# Patient Record
Sex: Female | Born: 1980 | Race: White | Hispanic: No | Marital: Married | Smoking: Never smoker
Health system: Southern US, Academic
[De-identification: ages and names within clinical notes are randomized; demographics above are authoritative.]

## PROBLEM LIST (undated history)

## (undated) DIAGNOSIS — N809 Endometriosis, unspecified: Secondary | ICD-10-CM

## (undated) DIAGNOSIS — K589 Irritable bowel syndrome without diarrhea: Secondary | ICD-10-CM

## (undated) DIAGNOSIS — G43909 Migraine, unspecified, not intractable, without status migrainosus: Secondary | ICD-10-CM

## (undated) DIAGNOSIS — M3500 Sicca syndrome, unspecified: Secondary | ICD-10-CM

## (undated) HISTORY — DX: Migraine, unspecified, not intractable, without status migrainosus: G43.909

## (undated) HISTORY — PX: COMBINED LAPAROSCOPY W/ HYSTEROSCOPY: SUR299

## (undated) HISTORY — DX: Sjogren syndrome, unspecified (CMS HCC): M35.00

## (undated) HISTORY — DX: Irritable bowel syndrome without diarrhea: K58.9

## (undated) HISTORY — PX: HX DILATION AND CURETTAGE: SHX78

## (undated) HISTORY — DX: Endometriosis, unspecified: N80.9

## (undated) HISTORY — PX: HX GALL BLADDER SURGERY/CHOLE: SHX55

## (undated) HISTORY — PX: LUMBAR DISC SURGERY: SHX700

## (undated) HISTORY — PX: HX APPENDECTOMY: SHX54

---

## 2006-01-02 ENCOUNTER — Other Ambulatory Visit (HOSPITAL_COMMUNITY): Payer: Self-pay | Admitting: OBSTETRICS/GYNECOLOGY

## 2021-03-20 ENCOUNTER — Ambulatory Visit (INDEPENDENT_AMBULATORY_CARE_PROVIDER_SITE_OTHER): Payer: Self-pay

## 2021-06-12 ENCOUNTER — Encounter (INDEPENDENT_AMBULATORY_CARE_PROVIDER_SITE_OTHER): Payer: Self-pay | Admitting: OBSTETRICS/GYNECOLOGY

## 2021-06-13 ENCOUNTER — Ambulatory Visit (INDEPENDENT_AMBULATORY_CARE_PROVIDER_SITE_OTHER): Payer: 59 | Admitting: OBSTETRICS/GYNECOLOGY

## 2021-06-13 ENCOUNTER — Other Ambulatory Visit: Payer: Self-pay

## 2021-06-13 ENCOUNTER — Encounter (INDEPENDENT_AMBULATORY_CARE_PROVIDER_SITE_OTHER): Payer: Self-pay | Admitting: OBSTETRICS/GYNECOLOGY

## 2021-06-13 ENCOUNTER — Other Ambulatory Visit: Payer: 59 | Attending: OBSTETRICS/GYNECOLOGY | Admitting: OBSTETRICS/GYNECOLOGY

## 2021-06-13 ENCOUNTER — Other Ambulatory Visit: Payer: 59 | Attending: OBSTETRICS/GYNECOLOGY

## 2021-06-13 VITALS — BP 115/82 | HR 76 | Ht 67.0 in | Wt 162.0 lb

## 2021-06-13 DIAGNOSIS — N951 Menopausal and female climacteric states: Secondary | ICD-10-CM

## 2021-06-13 DIAGNOSIS — N921 Excessive and frequent menstruation with irregular cycle: Secondary | ICD-10-CM

## 2021-06-13 DIAGNOSIS — R635 Abnormal weight gain: Secondary | ICD-10-CM | POA: Insufficient documentation

## 2021-06-13 DIAGNOSIS — Z01419 Encounter for gynecological examination (general) (routine) without abnormal findings: Secondary | ICD-10-CM

## 2021-06-13 DIAGNOSIS — N946 Dysmenorrhea, unspecified: Secondary | ICD-10-CM | POA: Insufficient documentation

## 2021-06-13 DIAGNOSIS — R5381 Other malaise: Secondary | ICD-10-CM | POA: Insufficient documentation

## 2021-06-13 DIAGNOSIS — R5383 Other fatigue: Secondary | ICD-10-CM

## 2021-06-13 DIAGNOSIS — N898 Other specified noninflammatory disorders of vagina: Secondary | ICD-10-CM

## 2021-06-13 LAB — COMPREHENSIVE METABOLIC PANEL, NON-FASTING
ALBUMIN/GLOBULIN RATIO: 1.7 — ABNORMAL HIGH (ref 0.8–1.4)
ALBUMIN: 4.7 g/dL (ref 3.5–5.7)
ALKALINE PHOSPHATASE: 61 U/L (ref 34–104)
ALT (SGPT): 23 U/L (ref 7–52)
ANION GAP: 9 mmol/L — ABNORMAL LOW (ref 10–20)
AST (SGOT): 19 U/L (ref 13–39)
BILIRUBIN TOTAL: 0.6 mg/dL (ref 0.3–1.2)
BUN/CREA RATIO: 10 (ref 6–22)
BUN: 8 mg/dL (ref 7–25)
CALCIUM, CORRECTED: 8.8 mg/dL — ABNORMAL LOW (ref 8.9–10.8)
CALCIUM: 9.5 mg/dL (ref 8.6–10.3)
CHLORIDE: 105 mmol/L (ref 98–107)
CO2 TOTAL: 23 mmol/L (ref 21–31)
CREATININE: 0.83 mg/dL (ref 0.60–1.30)
ESTIMATED GFR: 90 mL/min/{1.73_m2} (ref >59)
GLOBULIN: 2.7 — ABNORMAL LOW (ref 2.9–5.4)
GLUCOSE: 100 mg/dL (ref 74–109)
OSMOLALITY, CALCULATED: 272 mOsm/kg (ref 270–290)
POTASSIUM: 3.7 mmol/L (ref 3.5–5.1)
PROTEIN TOTAL: 7.4 g/dL (ref 6.4–8.9)
SODIUM: 137 mmol/L (ref 136–145)

## 2021-06-13 LAB — CBC WITH DIFF
BASOPHIL #: 0 10*3/uL (ref 0.00–0.30)
BASOPHIL %: 1 % (ref 0–3)
EOSINOPHIL #: 0 10*3/uL (ref 0.00–0.80)
EOSINOPHIL %: 1 % (ref 0–7)
HCT: 42.1 % (ref 37.0–47.0)
HGB: 14.2 g/dL (ref 12.5–16.0)
LYMPHOCYTE #: 1.6 10*3/uL (ref 1.10–5.00)
LYMPHOCYTE %: 24 % — ABNORMAL LOW (ref 25–45)
MCH: 29.7 pg (ref 27.0–32.0)
MCHC: 33.7 g/dL (ref 32.0–36.0)
MCV: 88.1 fL (ref 78.0–99.0)
MONOCYTE #: 0.4 10*3/uL (ref 0.00–1.30)
MONOCYTE %: 6 % (ref 0–12)
MPV: 9.1 fL (ref 7.4–10.4)
NEUTROPHIL #: 4.5 10*3/uL (ref 1.80–8.40)
NEUTROPHIL %: 69 % (ref 40–76)
PLATELETS: 247 10*3/uL (ref 140–440)
RBC: 4.78 10*6/uL (ref 4.20–5.40)
RDW: 14 % (ref 11.6–14.8)
WBC: 6.6 10*3/uL (ref 4.0–10.5)
WBCS UNCORRECTED: 6.6 10*3/uL

## 2021-06-13 LAB — THYROID STIMULATING HORMONE (SENSITIVE TSH): TSH: 0.654 u[IU]/mL (ref 0.450–5.330)

## 2021-06-13 LAB — C-REACTIVE PROTEIN(CRP),INFLAMMATION: C-REACTIVE PROTEIN (CRP): 0.4 mg/dL (ref 0.1–0.5)

## 2021-06-13 LAB — THYROXINE, FREE (FREE T4): THYROXINE (T4), FREE: 0.79 ng/dL (ref 0.58–1.64)

## 2021-06-13 LAB — FSH: FOLLICLE STIMULATING HORMONE: 5.84

## 2021-06-13 NOTE — Procedures (Signed)
[facility]  [address]  [place] [address]    Procedure Note    Name: Kelly Fuller MRN:  [id number]   Date: 06/13/2021 Age: 41 y.o.       Procedures    Conley Rolls, DO

## 2021-06-13 NOTE — Nursing Note (Signed)
Here for yearly exam.  LMP 05-19-21.  Period in March was 10 days late, April was few days early and May is late.  First time that she has had an issue with periods.  Periods are heavy, clots and gushes.  Has cramping and pain with cycles scale 7/10.    Having hot flashes occasionally.    Evette Cristal, CMA

## 2021-06-13 NOTE — Procedures (Signed)
[facility]  [address]  [place] [address]    Procedure Note    Name: Kelly Fuller MRN:  [id number]   Date: 06/13/2021 Age: 41 y.o.       [phone] -  WET MOUNT FOR INFECTIOUS AGENTS (EG, SALINE,INDIA INK, KOH) (AMB ONLY)  Performed by: Remonia Richter, DO  Authorized by: Remonia Richter, DO     Time Out:     Immediately before the procedure, a time out was called:  Yes    Patient verified:  Yes    Procedure Verified:  Yes    Site Verified:  Yes  Documentation:      Wet prep reveals squamous cells no white cells no hyphae no clue cells is normal wet        Remonia Richter, DO

## 2021-06-13 NOTE — Nursing Note (Signed)
Breakfast- nothing  Lunch-salad  Dinner- meat and vegatables  Snacks-popcorn  Drinks-water and diet soda  Exercise- 5 times a week

## 2021-06-13 NOTE — H&P (Signed)
[facility]  [address]  [place] [address]    Annual Exam    Name: Kelly Fuller MRN:  [id number]   Date: 06/13/2021 Age: 41 y.o.     Reason for Visit:  Chief Complaint   Patient presents with   . Annual Exam     Pap smear       History of Present Illness:   Kelly Fuller is a 41 y.o. female here for annual well woman exam.  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  For breakfast she does not eat breakfast lunch salads dinner meat and vegetables snacks popcorn water diet soda she is been exercises 5 times a week recently    Change in sexual partner: No  Abnormal pap history:  No  Last pap:      STD history:  none known  HRT use:  No  Last Mammogram:  No results found for this or any previous visit (from the past 58309 hour(s)).    Last DXA:  No results found for this or any previous visit (from the past [id number] hour(s)).  Menses:  Cycles one time per month  Contraception:  none    There are no problems to display for this patient.      Patient History:  Past Medical History:   Diagnosis Date   . Endometriosis    . Irritable bowel syndrome    . Migraines    . Sjogren's syndrome (CMS HCC)           Past Surgical History:   Procedure Laterality Date   . COMBINED LAPAROSCOPY W/ HYSTEROSCOPY N/A    . HX APPENDECTOMY     . HX CHOLECYSTECTOMY     . HX DILATION AND CURETTAGE N/A    . LUMBAR DISC SURGERY N/A           Family Medical History:     Problem Relation (Age of Onset)    Cancer Mother    Heart Disease Father, Mother          Social History     Socioeconomic History   . Marital status: Married    Tobacco Use   . Smoking status: Never   . Smokeless tobacco: Never   Vaping Use   . Vaping Use: Never used   Substance and Sexual Activity   . Alcohol use: Yes     Comment: rarely   . Drug use: Never   . Sexual activity: Yes     Birth control/protection: Vasectomy      Current Outpatient Medications   Medication Sig   . albuterol sulfate (PROVENTIL OR VENTOLIN OR PROAIR) 90 mcg/actuation Inhalation oral inhaler    . diclofenac sodium (VOLTAREN) 75 mg Oral Tablet, Delayed Release (E.C.) diclofenac sodium 75 mg tablet,delayed release (Patient not taking: Reported on 06/13/2021)   . dicyclomine (BENTYL) 20 mg Oral Tablet dicyclomine 20 mg tablet (Patient not taking: Reported on 06/13/2021)   . ergocalciferol, vitamin D2, (DRISDOL) 1,250 mcg (50,000 unit) Oral Capsule Vitamin D2 1,250 mcg (50,000 unit) capsule   . hydrOXYchloroQUINE (PLAQUENIL)  200 mg Oral Tablet hydroxychloroquine 200 mg tablet (Patient not taking: Reported on 06/13/2021)   . Ibuprofen (MOTRIN) 800 mg Oral Tablet    . metoclopramide HCl (REGLAN) 10 mg Oral Tablet metoclopramide 10 mg tablet (Patient not taking: Reported on 06/13/2021)     No Known Allergies    OB History   Gravida Para Term Preterm AB Living   0 0 0 0 0 0   SAB IAB Ectopic Multiple Live Births   0 0 0 0 0     Social History     Substance and Sexual Activity   Sexual Activity Yes   . Birth control/protection: Vasectomy       She wears her seatbelt.  She gets calcium in her diet.  Performing self breast evaluations monthly.  The patient denies any abuse or violence and feels safe at home.    Review of Systems:  The patient denies any bowel or bladder symptoms.   normal menses, no abnormal bleeding, pelvic pain or discharge, no breast pain or new or enlarging lumps on self exam, she complains of heavy irregular periods pain with her.  She is concerned endometriosis is recurrent she also has left breast pain 20 lb weight gain please are ideal body weight is approximately 140  lb  Constitutional:  No unexplained weight gain or loss, no loss of appetite, no fever, no night sweats or chills.  Ears nose mouth and throat:  No difficulty with hearing, no sinus problems, no runny nose, no postnasal drip, no ringing ears, no mouth sores, loose teeth, ear pain, nose bleeds, sore throat, facial pain or numbness.  Cardiovascular:  No irregular heart beats, racing heartbeat, chest pains, swelling of feet or legs, no pain in legs with walking.  Respiratory:  No shortness of breath, no night sweats, no prolonged cough, wheezing, sputum production, no hemoptysis.  Gastrointestinal:  No heartburn, constipation, intolerance to foods, diarrhea abdominal pain, difficulty swallowing, nausea, vomiting, and no change in bowel movements.  Integument:  No rash, itching, skin lesions, change in hair.  Neurologic:  No frequent headaches, double vision, weakness, changes in station, problems with walking or balance, dizziness, tremor, loss of consciousness no visual loss.  Psychiatric:  No insomnia, irritability, depression, anxiety, mood swings, hallucinations.  Endocrine no intolerance to heat and cold, no frequent hunger, urination, thirst.  No changes sex drive.  Hematologic:  No easy bleeding, easy bruising, anemia, abnormal swollen areas.  Allergy/immunology:  No seasonal allergies, hay fever, itching, frequent infections, no exposure to HIV.    Physical Exam:  BP 115/82   Pulse 76   Ht 1.702 m (5\' 7" )   Wt 73.5 kg (162 lb)   LMP 05/19/2021   Breastfeeding No   BMI 25.37 kg/m        General: No acute distress.  Skin: Intact.  No visible rashes, no jaundice.  HEENT: Normocephalic, Atraumatic, No obvious lesions.  Neck/Thyroid: No evidence of thyromegaly or lymphadenopathy.  Respiratory: Lungs clear to auscultation bilaterally.  Cardiac: Regular rate and rhythm without murmur, rub, or gallop.  Abdomen: Soft, Non tender, Non distended without guarding or rebound.   Extremities: No cyanosis, clubbing, or  edema.  Neurologic: Orientation appropriate. No gross motor abnormalities or deficits noted.  Psychiatric: Mood stable.    Breasts:  No masses or nodes  Genitourinary:   External Exam: External exam normal female genitalia.  No urethral tenderness.  No genital lesions present.    Vaginal Exam:  Vagina normal to exam. Moist  and pink vaginal mucosa, no abnormal discharge.   Bladder:  Bladder is normal to exam. Suprapubic fullness not present.    Uterus: Uterus is normal to exam. Cervix is normal to exam. No cervical discharge and no cervical motion tenderness.   Position:  Uterus top-normal size anteflexed   Contour:  Regular.    Mobility:  Mobile.    Adnexa:  No palpable masses and no tenderness bilaterally.    Rectovaginal exam:   Not done     Assessment/Plan:   Routine gynecological exam with cervical pap smear.    ENCOUNTER DIAGNOSES     ICD-10-CM   1. Encounter for well woman exam with routine gynecological exam  Z01.419   2. Metrorrhagia  N92.1   3. Menopausal symptoms  N95.1   4. Malaise and fatigue  R53.81    R53.83   5. Abnormal weight gain  R63.5        Counseling/Education:  Reviewed ASCCP guidelines for cervical cancer screening.  Reviewed breast self exams and breast awareness.    A lipid panel should be followed with her PCP.    A mammogram will be ordered yearly.  The patient was counseled on diet, exercise, calcium in her diet, and avoidance of tobacco products.  Colonoscopy guidelines discussed and immunizations.    Orders Placed This Encounter   . CBC/DIFF   . C-REACTIVE PROTEIN(CRP),INFLAMMATION   . COMPREHENSIVE METABOLIC PANEL, NON-FASTING   . FSH   . THYROID STIMULATING HORMONE (SENSITIVE TSH)   . THYROXINE, FREE (FREE T4)   . T3 (TRIIODOTHYRONINE), FREE, SERUM   . THIN PREP PAP REFLEX TO HPV MRNA IF ASCUS ([facility])     Return in about 2 weeks (around 06/27/2021) for ultrasound.  Ordered labs including CBC CMP CRP urinalysis C&S discussed her diet discussed weight loss discussed medical management  of her weight loss including phentermine get her handouts on laparoscopy hysteroscopy menopausal symptoms endometriosis endometrial ablation she would a laparoscopic cholecystectomy and laparoscopic appy by Dr.Eeels in [place] in 2020 wet prep was also obtained which is negative    Remonia Richterandy Aland Chestnutt, DO  06/13/2021, 12:18

## 2021-06-14 LAB — T3 (TRIIODOTHYRONINE), FREE, SERUM: T3 FREE: 2.6 pg/mL (ref 1.7–3.7)

## 2021-06-28 ENCOUNTER — Other Ambulatory Visit (INDEPENDENT_AMBULATORY_CARE_PROVIDER_SITE_OTHER): Payer: Self-pay | Admitting: OBSTETRICS/GYNECOLOGY

## 2021-07-04 ENCOUNTER — Encounter (INDEPENDENT_AMBULATORY_CARE_PROVIDER_SITE_OTHER): Payer: Self-pay | Admitting: OBSTETRICS/GYNECOLOGY

## 2021-07-07 LAB — THIN PREP PAP REFLEX TO HPV MRNA IF ASCUS (QUEST)

## 2021-08-20 ENCOUNTER — Encounter (INDEPENDENT_AMBULATORY_CARE_PROVIDER_SITE_OTHER): Payer: 59 | Admitting: OBSTETRICS/GYNECOLOGY

## 2022-05-11 ENCOUNTER — Emergency Department: Payer: 59 | Admitting: Physician Assistant

## 2022-05-11 DIAGNOSIS — U071 COVID-19: Secondary | ICD-10-CM

## 2022-05-11 MED ORDER — MOLNUPIRAVIR 200 MG CAPSULE (EUA)
800.0000 mg | ORAL_CAPSULE | Freq: Two times a day (BID) | ORAL | 0 refills | Status: AC
Start: 2022-05-11 — End: 2022-05-16

## 2022-05-11 MED ORDER — NIRMATRELVIR 300 MG (150 MG X2)-RITONAVIR 100 MG TABLET,DOSE PACK
3.0000 | ORAL_TABLET | Freq: Two times a day (BID) | ORAL | 0 refills | Status: DC
Start: 2022-05-11 — End: 2022-05-11

## 2022-05-11 MED ORDER — OXYMETAZOLINE 0.05 % NASAL SPRAY
1.0000 | Freq: Two times a day (BID) | NASAL | 0 refills | Status: AC | PRN
Start: 2022-05-11 — End: ?

## 2022-05-11 MED ORDER — SODIUM BICARBONATE-SODIUM CHLORIDE-NETI POT NASAL RINSE WITH PACKET
1.0000 | PACK | Freq: Two times a day (BID) | ENDOSINUSIAL | 0 refills | Status: AC
Start: 2022-05-11 — End: ?

## 2022-05-11 MED ORDER — BENZONATATE 100 MG CAPSULE
ORAL_CAPSULE | ORAL | 0 refills | Status: AC
Start: 2022-05-11 — End: ?

## 2022-05-11 NOTE — E-Visit Routing Comment (Signed)
Evisit received, reviewed and forwarded to provider pool as per patient request and per protocol.London Pepper, RN  05/11/2022 21:46

## 2022-05-11 NOTE — Progress Notes (Signed)
[facility]  [address]  [place] [address]  E-Visit Note  Attending Dr. Zada Girt  Reason for E-Visit: Visit Diagnosis with associated orders:     ICD-10-CM    1. COVID-19  U07.1         [facility] E-Visit Covid-19 Base       Question 05/11/2022  9:27 PM EDT - Kelly Fuller by Patient    Have you been in direct contact with a person with COVID-19 within the last 14 days? Yes    Have you tested positive for COVID-19? Yes    Please enter the date you tested positive.  05/11/2022    Are you taking any medications for your symptoms? No    When did you first start experiencing symptoms related to COVID-19? 05/10/2022    Do you have a fever or are feeling feverish (chills, sweating)? Yes    If you have a thermometer, what is your current temperature (in [place])?  (range: 0 - 150) 101.7    Have you had any of the following symptoms?     Are you having a cough today? Yes    Do you have a runny or stuffy nose?   Yes    Are you experiencing severe and constant pain or pressure in the chest? Yes    Do you have loss of taste? No    Do you have loss of smell? No    Are you vomiting? No    Are you experiencing diarrhea? Yes    Are you experiencing fatigue today? Yes    Do you have muscle aches, body aches, or a headache? Yes    Are you having difficulty breathing?  No trouble breathing    Do you feel more ill than you did yesterday or about the same? More ill    Is there someone regularly available to help you take care of yourself? Yes    Do any of these apply to you? (Check any) Weakened immune system or taking medications that may cause immune suppression            No images are attached to the encounter or orders placed in the encounter.   Assessment:   Assessment/Plan   1. COVID-19        I'm sorry to hear your COVID-19 test result came back positive. Unfortunately antibiotics do not work with viral illnesses. I am recommending a home quarantine. You shall not leave your own home for any  reason other than hospitalization for 5 days from the date of your positive test, or the start of symptoms, which ever day came first. You will need to immediately notify your employer and other household members. ALL household members are also quarantined due to potential exposure if they have not been vaccinated against the virus. It is possible to transmit the disease even when you do not have any symptoms at all. Sometimes after 1 week of symptoms, COVID can severely worsen, causing breathing difficulty. If you feel short of breath or have chest pain you need to go immediately to the emergency department. Please take tylenol and other OTC meds for symptomatic relief.  Do not eat or drink after others, wear a mask if other household members are nearby. Sanitize surfaces frequently. Sneeze into the crease of the elbow and wash hands very frequently. It is important that unaffected individuals also wash hands frequently and do not touch his or her face. Stay at least 6 feet  away from all other individuals at all times.  You will start quarantine then then assess your symptoms and temperature at the end of the 5 days. If you are not feeling significantly better, or you still have a fever (100.46F +) you will need to extend the quarantine by an additional 5 days, for a total of 10 days. Please note that if you are feeling completely better and you return to work/school, you must wear a mast at all times for the first 5 days of return. This mask must not be home-made, must fit snuggly over your mouth and nose, and may not be porous or see-through. If you cannot tolerate the mask, are too young to wear a mask, or reliably and consistently wear the mask as described, you will need to extend the quarantine to a full 10 days. Again, if you are feeling short of breath please go to the nearest emergency department. Please get plenty of rest and stay hydrated by drinking plenty of fluids. This is the mainstay of treatment for  most viral infections. If you have a fever for more than 5 days, or develop one later in the illness please be seen in person for further evaluation. I have sent symptomatic meds to your pharmacy. If you have any severe symptoms go to the ER.     If you are over the age of 19 and/or have any long term health issues such as diabetes, HIV, morbid obesity or other risk factors for severe COVID, please contact your PCP right away to discuss antiviral treatment.     Due to your age and/or indicated health risk factors, I have sent an antiviral to your pharmacy. Do not take cholesterol-lowering medications such as statins while taking this medication. Additionally, if you've ever had any kidney disease you should talk to your doctor about having blood work rechecked before starting the medication. Keep in mind, that if you wish to take the antiviral, you must start it within the first 5 days of symptoms.    Orders Placed This Encounter    nirmatrelvir-ritonavir (PAXLOVID) 300 mg (150 mg x 2)-100mg  tablet therapy pack    oxymetazoline (AFRIN) 0.05 % Nasal Spray, Non-Aerosol    sod bicarb-sod chlor-neti pot sinus irrigation packet with rinse device    benzonatate (TESSALON PERLES) 100 mg Oral Capsule        Preferred Pharmacy       [facility] [address] [place], [place] - [address]    [address] [place] [address]    Phone: [phone] Fax: [phone]    Hours: Not open 24 hours          If for some reason you need to change your pharmacy, simply call the new preferred pharmacy and ask them to retrieve the prescription from the above listed pharmacy.     The patient (or their delegate) initiated request for e-visit using MyChart patient portal.  See MyChart messages documented in this encounter for details of online digital evaluation and management provided.    I personally spent 6 minutes of cumulative time performing this service (within 7 days since e-visit initiated).    Evlyn Courier Brimley, PA-C   05/11/2022, 21:46

## 2022-05-11 NOTE — Addendum Note (Signed)
Addended by: Aggie Hacker RENEE on: 05/11/2022 10:08 PM     Modules accepted: Orders

## 2022-11-08 IMAGING — MR MRI CERVICAL SPINE WITHOUT CONTRAST
4 of 5 series · 24 of 48 positions shown · IV contrast (gadolinium)
Comparison: None available.

﻿EXAM:  [id number]   MRI CERVICAL SPINE WITHOUT CONTRAST
INDICATION: 42-year-old with chronic neck pain. Prior C-spine surgery at C5-6 level.
TECHNIQUE: Multiplanar multisequential MRI of the C-spine was performed without gadolinium contrast.

[Series 5: T2 · sagittal · 3.5mm · 0.75mm/px · 8 of 13 slices shown (1 of 2)]
[im 1/13]
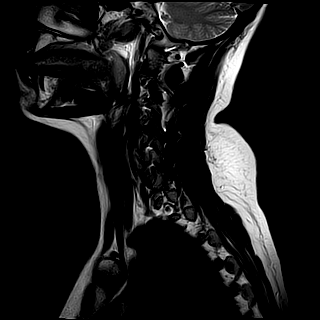
[im 2/13]
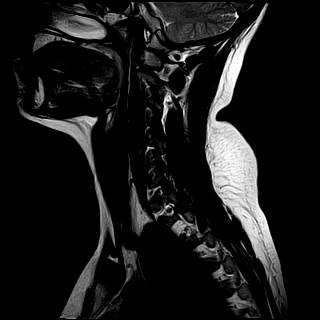
[im 4/13]
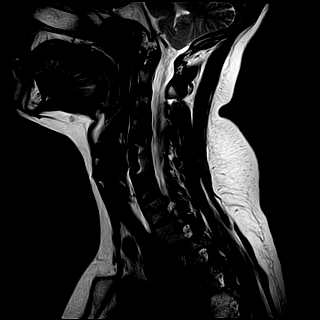
[im 6/13]
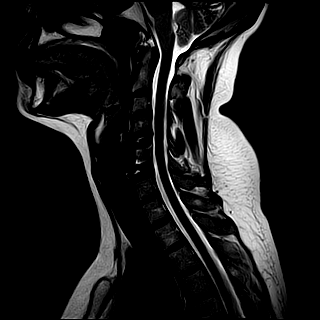
[im 7/13]
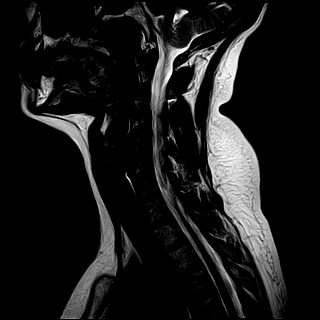
[im 9/13]
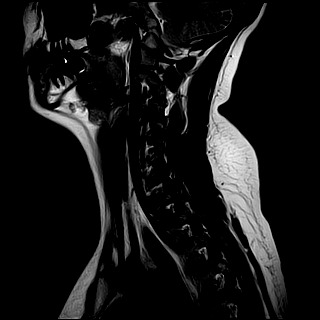
[im 11/13]
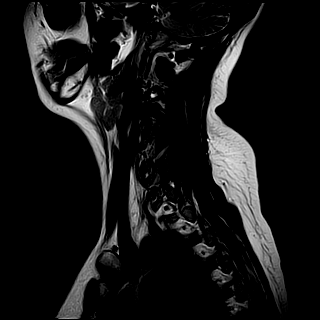
[im 13/13]
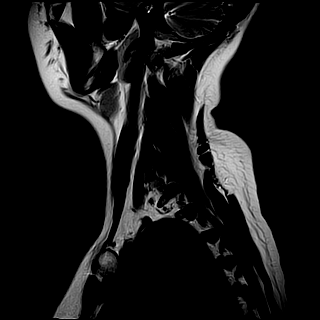

[Series 6: T1 · sagittal · 3.5mm · 0.47mm/px · 4 of 13 slices shown]
[im 1/13]
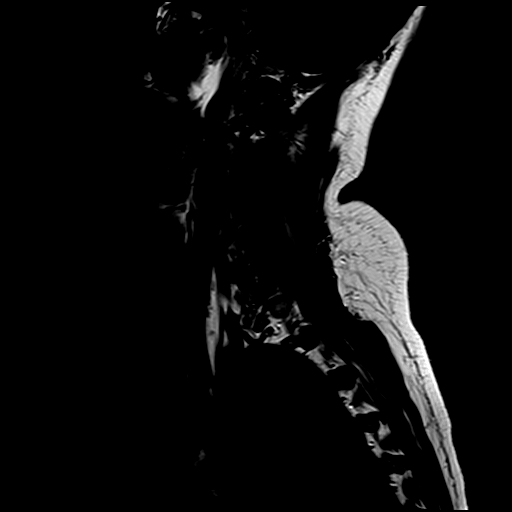
[im 2/13]
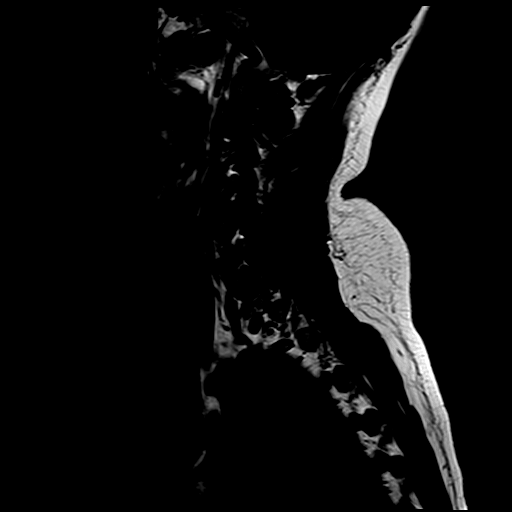
[im 7/13]
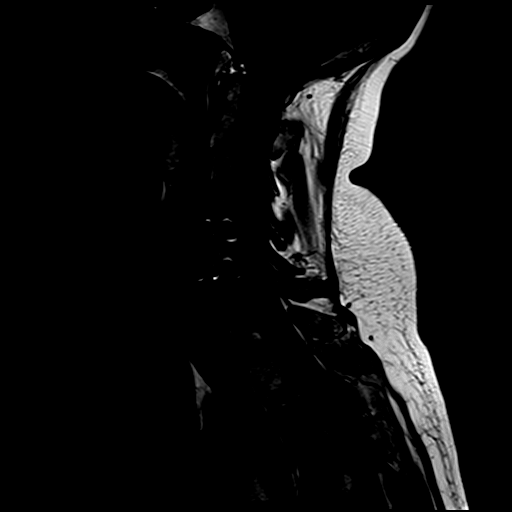
[im 11/13]
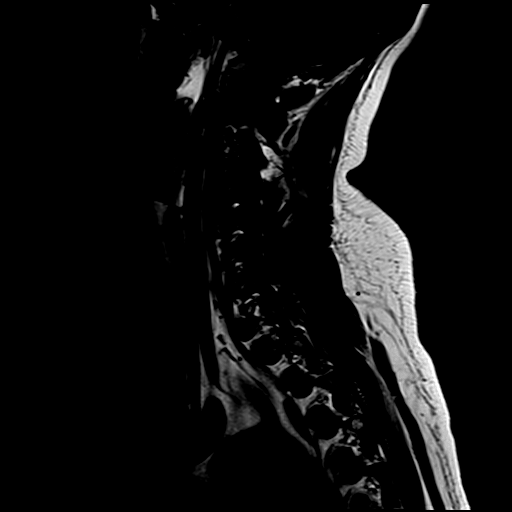

[Series 7: STIR · sagittal · 3.5mm · 0.47mm/px · 3 of 13 slices shown]
[im 2/13]
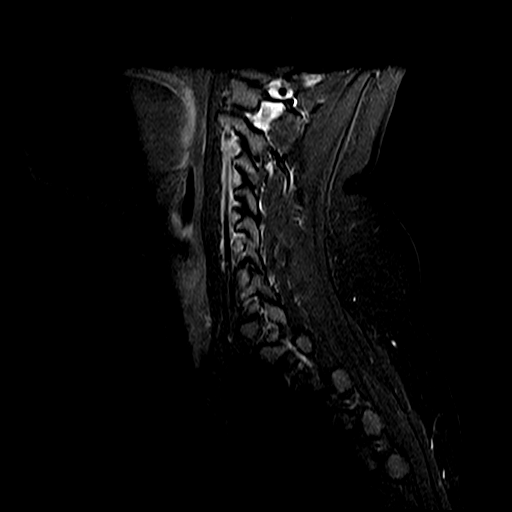
[im 7/13]
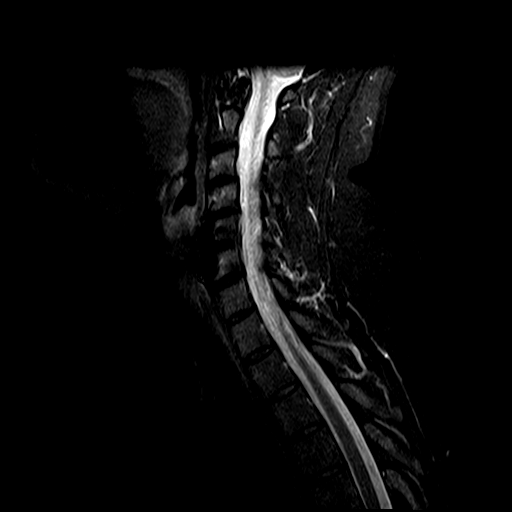
[im 11/13]
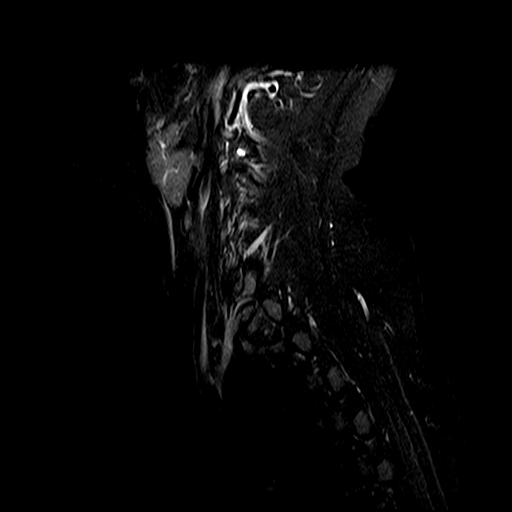

[Series 9: T2 · axial · 3.0mm · 0.39mm/px · z∈[-68,+40]mm · 9 of 18 slices shown (2 of 2)]
[im 1/18]
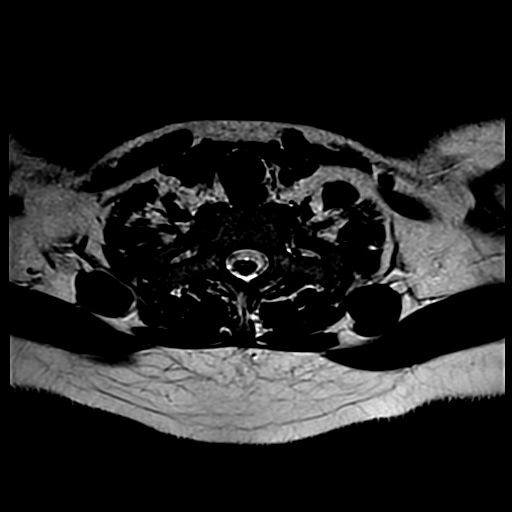
[im 4/18]
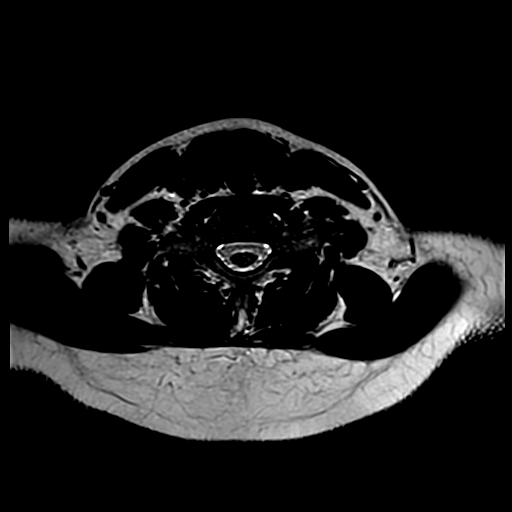
[im 5/18]
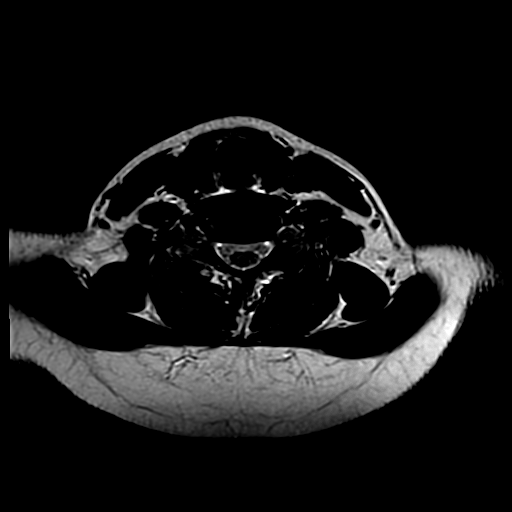
[im 8/18]
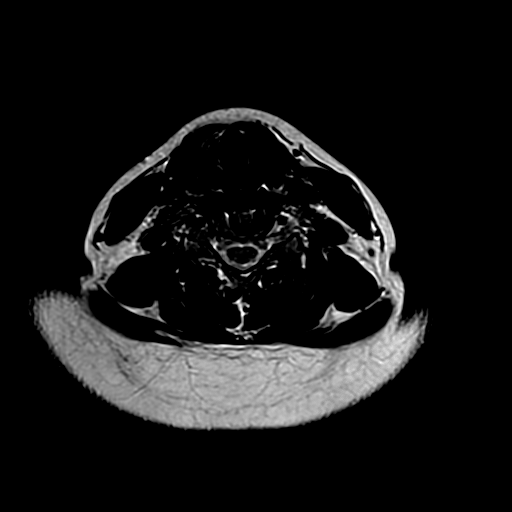
[im 10/18]
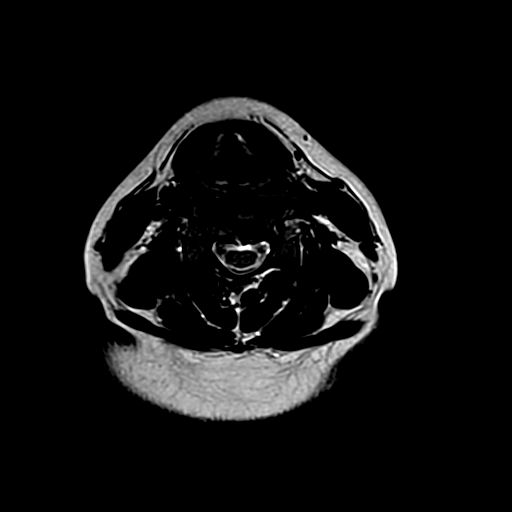
[im 13/18]
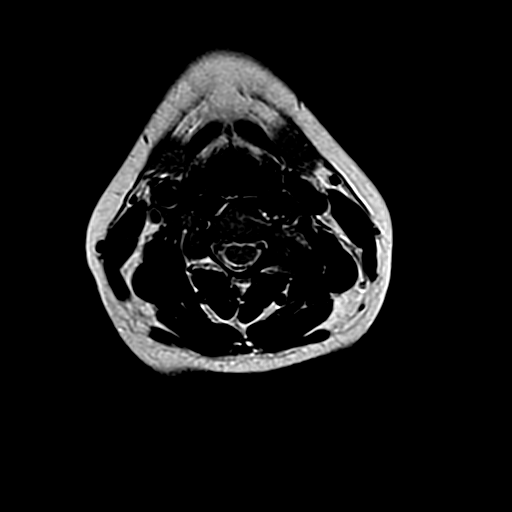
[im 14/18]
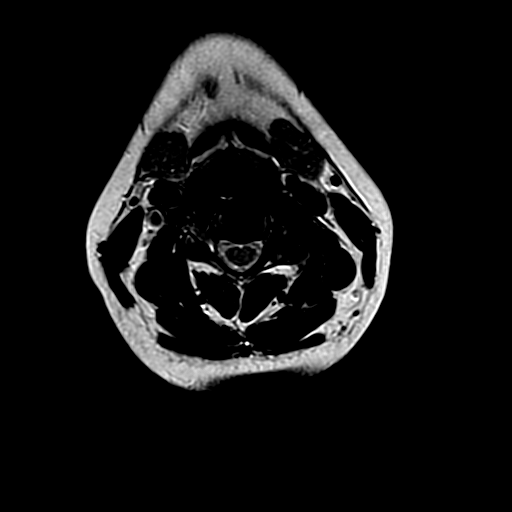
[im 16/18]
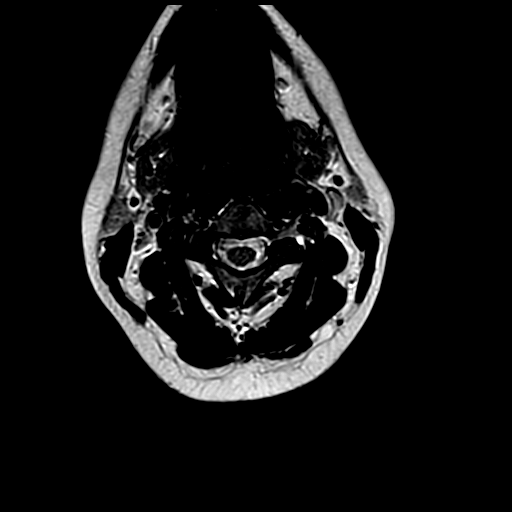
[im 18/18]
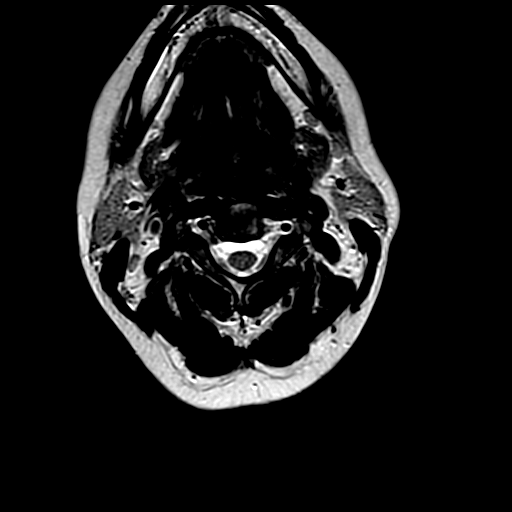

[24 of 48 positions shown; findings below may reference images not displayed]

FINDINGS: No acute bony lesions of cervical vertebrae.  Structures of the foramen magnum are normal in the sagittal view. 

At C2-3 level, no focal disc lesions are seen. 

At C3-4 level, no focal disc lesions are seen. 

At C4-5 level, no significant focal disc lesions are seen. 

At C5-6 level, postsurgical changes with anterior fixation device is noted. No evidence of spinal stenosis or foraminal stenosis is seen. 

At C6-7 level, no significant focal disc lesions are seen.  

C7-T1 disc is normal. 

Cervical spinal cord shows no focal lesions. Paravertebral soft tissues are unremarkable.
IMPRESSION: 1. No acute or focal bone changes of cervical vertebrae.

2. Postsurgical changes at C5-6 level with anterior fixation device.

3. Findings at other levels are described above in detail.

4. Cervical spinal cord shows no focal abnormalities.

## 2022-11-08 IMAGING — MR MRI LUMBAR SPINE WITHOUT CONTRAST
6 series · 40 of 48 positions shown · IV contrast (gadolinium)
Comparison: None available.

﻿EXAM:  [id number]   MRI LUMBAR SPINE WITHOUT CONTRAST
INDICATION: 42-year-old with persistent low back pain. No prior back surgery or malignancy..
TECHNIQUE: Multiplanar multisequential MRI of the lumbosacral spine was performed without gadolinium contrast.

[Series 5: T2 · sagittal · 4.0mm · 0.94mm/px · 5 of 13 slices shown (1 of 3)]
[im 1/13]
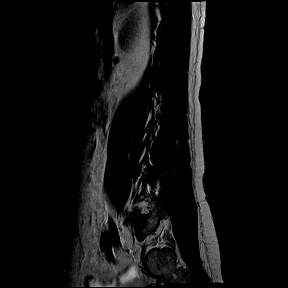
[im 4/13]
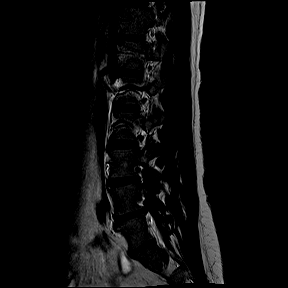
[im 7/13]
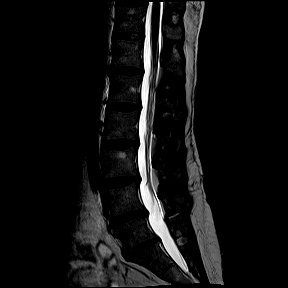
[im 10/13]
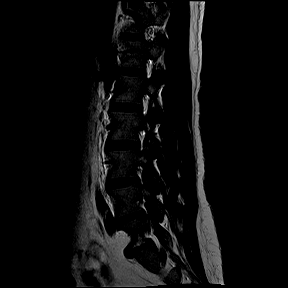
[im 13/13]
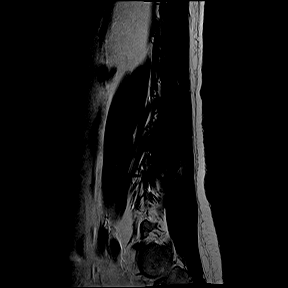

[Series 6: T1 · sagittal · 4.0mm · 0.94mm/px · 6 of 13 slices shown (1 of 2)]
[im 1/13]
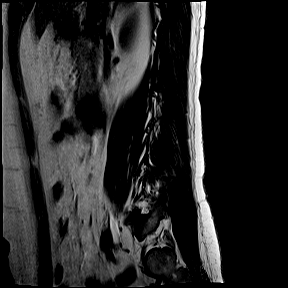
[im 3/13]
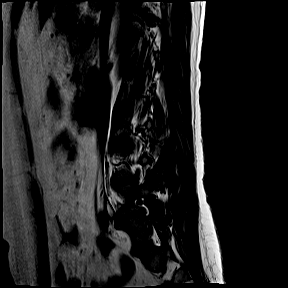
[im 5/13]
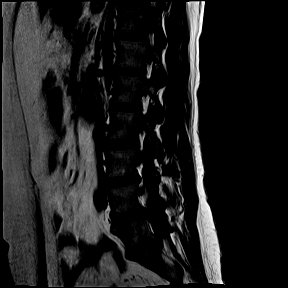
[im 8/13]
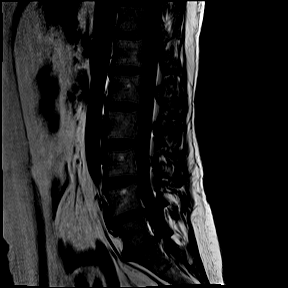
[im 10/13]
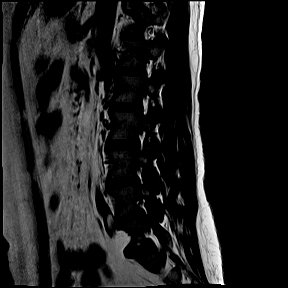
[im 13/13]
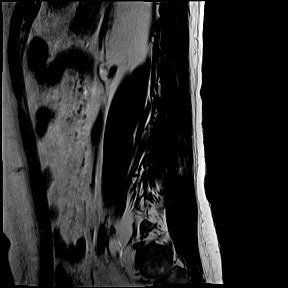

[Series 7: STIR · sagittal · 4.0mm · 1.05mm/px · 1 of 13 slices shown]
[im 1/13]
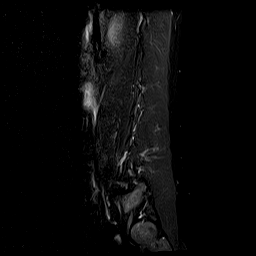

[Series 8: T2 · axial · 4.0mm · 0.47mm/px · z∈[-146,+63]mm · 11 of 25 slices shown (2 of 3)]
[im 1/25]
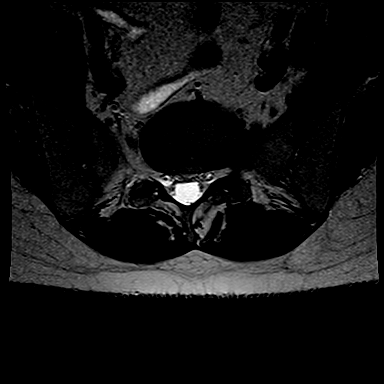
[im 3/25]
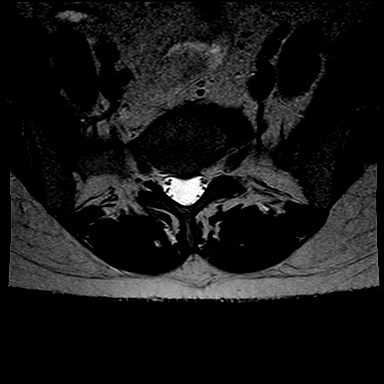
[im 5/25]
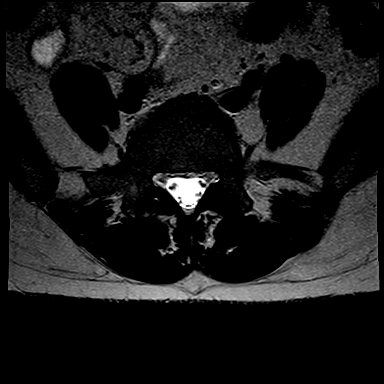
[im 8/25]
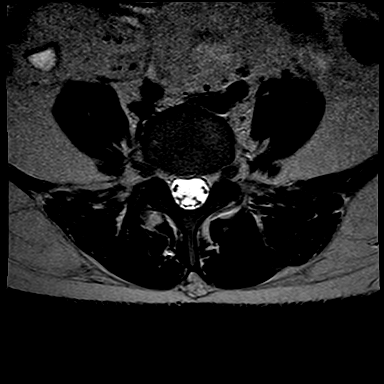
[im 10/25]
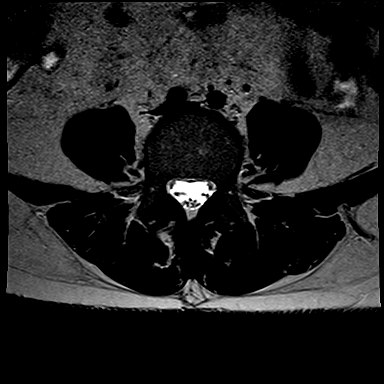
[im 13/25]
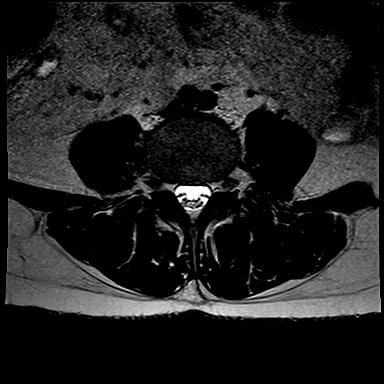
[im 15/25]
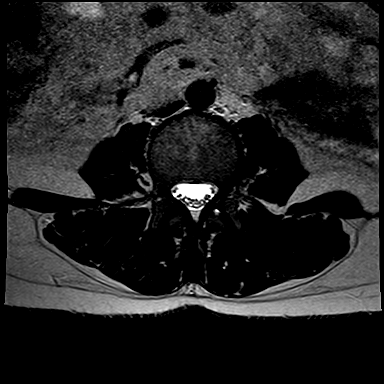
[im 17/25]
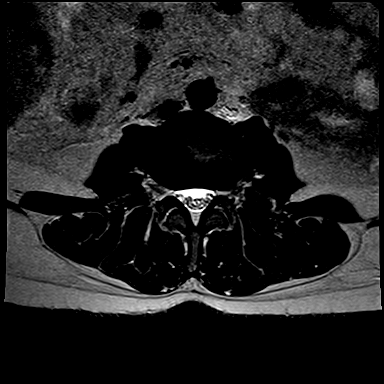
[im 20/25]
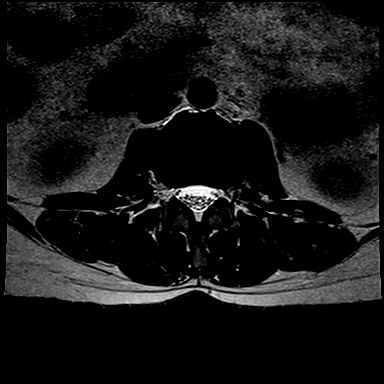
[im 22/25]
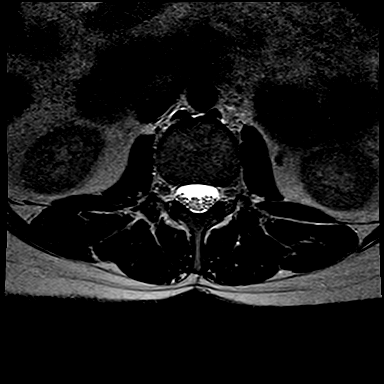
[im 25/25]
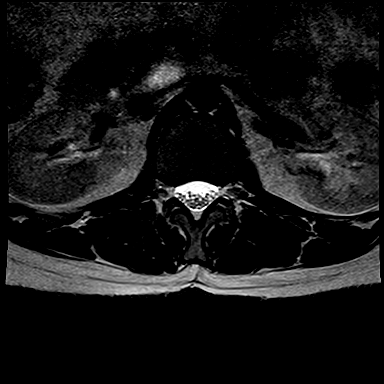

[Series 9: T1 · axial · 4.0mm · 0.47mm/px · z∈[-146,+63]mm · 8 of 25 slices shown (2 of 2)]
[im 1/25]
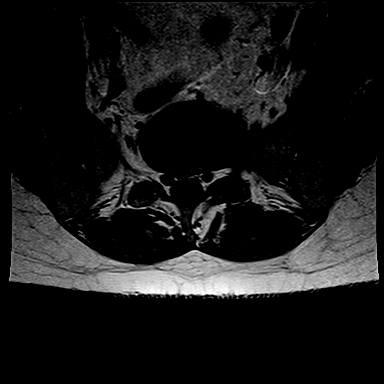
[im 5/25]
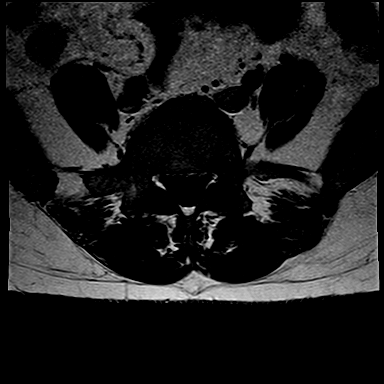
[im 8/25]
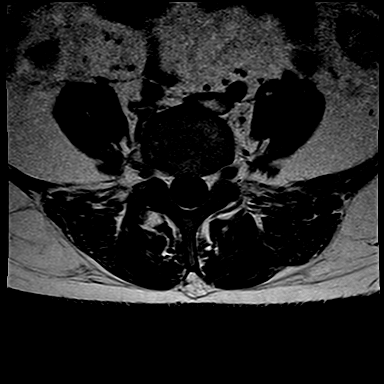
[im 10/25]
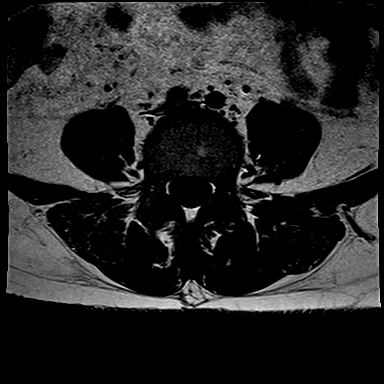
[im 15/25]
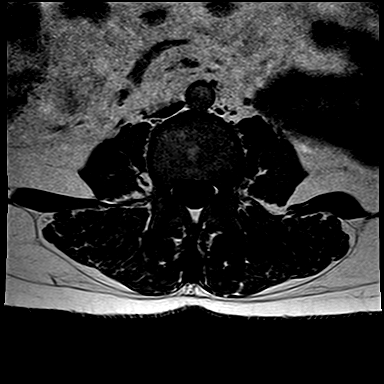
[im 17/25]
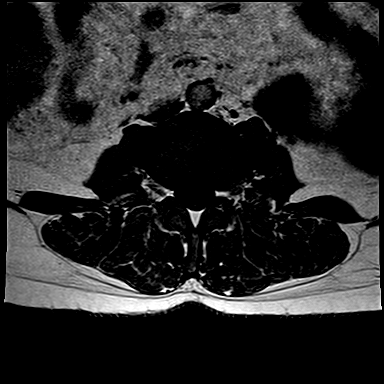
[im 20/25]
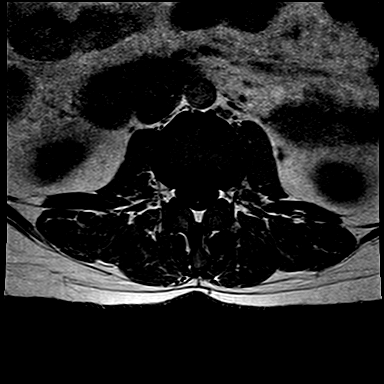
[im 25/25]
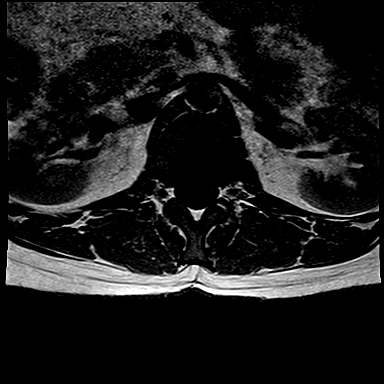

[Series 10: T2 · coronal · 4.0mm · 0.90mm/px · 9 of 20 slices shown (3 of 3)]
[im 1/20]
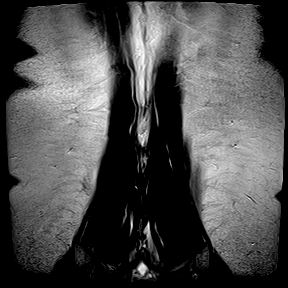
[im 3/20]
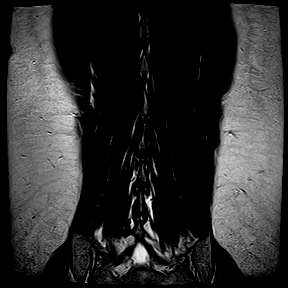
[im 5/20]
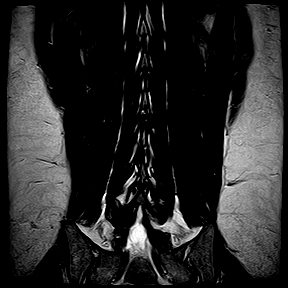
[im 8/20]
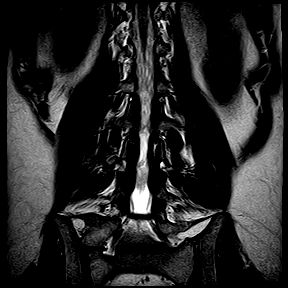
[im 10/20]
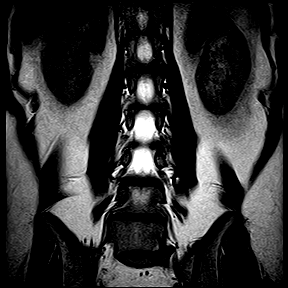
[im 12/20]
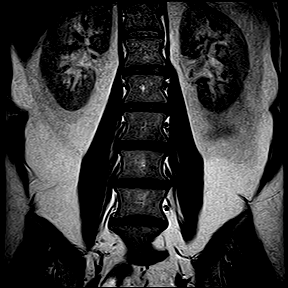
[im 15/20]
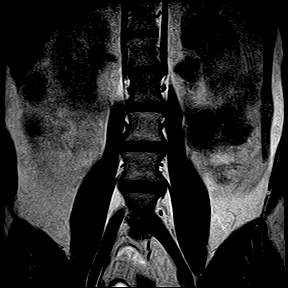
[im 17/20]
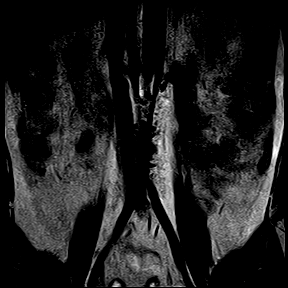
[im 20/20]
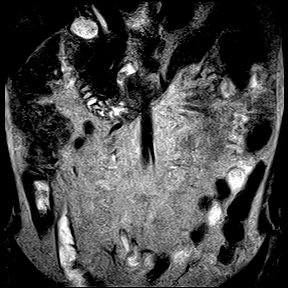

[40 of 48 positions shown; findings below may reference images not displayed]

FINDINGS: Transitional lumbosacral junction is noted with lowest visible disc labeled S1-S2 for the purposes of this report. 

Conus terminates at L1 level.  At L1-2 disc level, no focal disc lesions are seen.

At L2-3 level, mild degenerative disc changes and facet arthropathy without significant compromise of thecal sac or neural foramina. 

At L2-3 level, degenerative disc disease of mild degree and a moderate degree of facet arthropathy left more than the right causing moderate compromise of both lateral recesses and neural foramina, left more than the right.  AP diameter of thecal sac in the midline measures 11 mm. 

At L4-5 level, degenerative disc disease with bulging annulus and facet arthropathy is causing moderate to significant compromise of lateral recess and neural foramina on both sides. AP diameter of thecal sac in the midline measures 9.5 mm. 

At L5-S1 level, degenerative disc disease with asymmetric bulging annulus to the right with facet arthropathy causing significant right foraminal narrowing and moderate left foraminal narrowing.

At S1-2 level, degenerative disc disease is noted with focal bulging annulus in the midline, 12 mm in diameter impinging on the thecal sac and lateral recess on both sides, especially on the left S1 nerve root.

Paravertebral soft tissues are unremarkable.
IMPRESSION: 1. Transitional lumbosacral junction as described above with the lowest visible disc labeled S1-S2 for the purposes of this report.  Any surgical procedure should be preceded by confirmation of disc level.

2. At L4-5 level, degenerative disc disease with bulging annulus and facet arthropathy is causing moderate to significant compromise of lateral recess and neural foramina on both sides. AP diameter thecal sac in the midline measures 9.5 mm. 

3. At L5-S1 level, degenerative disc disease with asymmetric bulging annulus to the right with facet arthropathy causing significant right foraminal narrowing and moderate left foraminal narrowing.

4. At S1-2 level, degenerative disc disease is noted with focal bulging annulus in the midline, 12 mm in diameter impinging on the thecal sac and lateral recess on both sides, especially on the left S1 nerve root.

5. Findings at other levels are described above in detail.
